# Patient Record
Sex: Female | Born: 1985
Health system: Southern US, Community
[De-identification: ages and names within clinical notes are randomized; demographics above are authoritative.]

## PROBLEM LIST (undated history)

## (undated) DIAGNOSIS — Z789 Other specified health status: Secondary | ICD-10-CM

## (undated) HISTORY — PX: NO PAST SURGERIES: SHX2092

## (undated) HISTORY — PX: MYRINGOTOMY WITH TUBE PLACEMENT: SHX5663

---

## 2015-02-08 ENCOUNTER — Other Ambulatory Visit: Payer: Self-pay | Admitting: General Surgery

## 2015-03-02 ENCOUNTER — Encounter: Payer: Managed Care, Other (non HMO) | Attending: General Surgery | Admitting: Dietician

## 2015-03-02 ENCOUNTER — Encounter: Payer: Self-pay | Admitting: Dietician

## 2015-03-02 DIAGNOSIS — Z713 Dietary counseling and surveillance: Secondary | ICD-10-CM | POA: Diagnosis not present

## 2015-03-02 DIAGNOSIS — Z6841 Body Mass Index (BMI) 40.0 and over, adult: Secondary | ICD-10-CM | POA: Insufficient documentation

## 2015-03-02 NOTE — Progress Notes (Signed)
  Pre-Op Assessment Visit:  Pre-Operative Sleeve gastrectomy Surgery  Medical Nutrition Therapy:  Appt start time: 140   End time:  230.  Patient was seen on 03/02/2015 for Pre-Operative Nutrition Assessment. Assessment and letter of approval faxed to [facility] Bariatric Surgery Program coordinator on 03/02/2015.   Preferred Learning Style:   No preference indicated   Learning Readiness:   Ready  Handouts given during visit include:  Pre-Op Goals Bariatric Surgery Protein Shakes   During the appointment today the following Pre-Op Goals were reviewed with the patient: Maintain or lose weight as instructed by your surgeon Make healthy food choices Begin to limit portion sizes Limited concentrated sugars and fried foods Keep fat/sugar in the single digits per serving on   food labels Practice CHEWING your food  (aim for 30 chews per bite or until applesauce consistency) Practice not drinking 15 minutes before, during, and 30 minutes after each meal/snack Avoid all carbonated beverages  Avoid/limit caffeinated beverages  Avoid all sugar-sweetened beverages Consume 3 meals per day; eat every 3-5 hours Make a list of non-food related activities Aim for 64-100 ounces of FLUID daily  Aim for at least 60-80 grams of PROTEIN daily Look for a liquid protein source that contain ?15 g protein and ?5 g carbohydrate  (ex: shakes, drinks, shots)  Patient-Centered Goals: -Able to be active with kids -Intimacy -Prevention of health problems -Feeling more comfortable and improved self esteem  Scale of 1-10: confidence (10) /importance (10)  Demonstrated degree of understanding via:  Teach Back  Teaching Method Utilized: Visual Auditory Hands on  Barriers to learning/adherence to lifestyle change: none  [facility] to [facility] the [facility] to enroll in Pre-Op and Post-Op Nutrition Education when surgery date is scheduled.

## 2015-04-06 ENCOUNTER — Encounter: Payer: Managed Care, Other (non HMO) | Attending: General Surgery | Admitting: Dietician

## 2015-04-06 ENCOUNTER — Encounter: Payer: Self-pay | Admitting: Dietician

## 2015-04-06 DIAGNOSIS — Z6841 Body Mass Index (BMI) 40.0 and over, adult: Secondary | ICD-10-CM | POA: Diagnosis not present

## 2015-04-06 DIAGNOSIS — Z713 Dietary counseling and surveillance: Secondary | ICD-10-CM | POA: Diagnosis not present

## 2015-04-06 NOTE — Progress Notes (Signed)
Supervised Weight Loss:  Appt start time: 1015 end time:  1030.  SWL visit 1:  Primary concerns today: Beverly Christian returns today for her 1st SWL visit in preparation for sleeve gastrectomy having lost 1/2 a pound. She has been working on cutting back on Coke (caffeine) and chocolate. Her grandfather passed away and she has also been sick lately.   Weight: 270.4 lbs BMI: 44.4  Goals: -Keep working on cutting back on caffeine -Work on reducing portion sizes -Practice cutting back on fast food  MEDICATIONS: see list  DIETARY INTAKE:  24-hr recall:  B ( AM): sausage biscuit  Snk ( AM): apple or fruit cup  L ( PM): [place]key sandwich  Snk ( PM): carrots D ( PM): chicken and potato salad  Snk ( PM): sometimes a cookie or Cheezits  Beverages: tea, water  Recent physical activity: none  Estimated energy needs: 1500-1600 calories  Progress Towards Goal(s):  In progress.   Nutritional Diagnosis:  [place]-3.3 Overweight/obesity related to past poor dietary habits and physical inactivity as evidenced by patient in SWL for pending bariatric surgery following dietary guidelines for continued weight loss.     Intervention:  Nutrition counseling provided.  Handouts given during visit include:  Fast food guide  Monitoring/Evaluation:  Dietary intake, exercise, and body weight in 4 week(s).

## 2015-04-06 NOTE — Patient Instructions (Signed)
-  Keep working on cutting back on caffeine -Work on reducing portion sizes -Practice cutting back on fast food

## 2015-05-04 ENCOUNTER — Ambulatory Visit: Payer: Managed Care, Other (non HMO) | Admitting: Dietician

## 2015-05-07 ENCOUNTER — Encounter: Payer: Managed Care, Other (non HMO) | Attending: General Surgery | Admitting: Dietician

## 2015-05-07 ENCOUNTER — Encounter: Payer: Self-pay | Admitting: Dietician

## 2015-05-07 DIAGNOSIS — Z6841 Body Mass Index (BMI) 40.0 and over, adult: Secondary | ICD-10-CM | POA: Insufficient documentation

## 2015-05-07 DIAGNOSIS — Z713 Dietary counseling and surveillance: Secondary | ICD-10-CM | POA: Diagnosis not present

## 2015-05-07 NOTE — Progress Notes (Signed)
Supervised Weight Loss:  Appt start time: 440 end time:  455  SWL visit 2:  Primary concerns today: Returns with a 4 lbs weight gain. Has been working on decreasing portion sizes. Feels like she has had a busy month. Eating 3 meals per day. Still drinking Coke. Has not tried protein shakes. Has been walking 1-2 x week for about 30 minutes.   Weight: 274.5 lbs BMI: 45.0  Goals: -Keep working on cutting back on caffeine (Coke, chocolate, tea) -Talk to your doctor about migraine medication -Work on reducing portion sizes -Practice cutting back on fast food/do some more meal planning -Work on not drinking during (no drinking 15 minutes before, during a meal, and waiting 30 minutes after meal) -Start working on chewing 20-30 x per bite (applesauce consistently) (take 20 minutes to eat when possible)  MEDICATIONS: see list  DIETARY INTAKE:  24-hr recall:  B ( AM): sausage biscuit  Snk ( AM): apple or fruit cup  L ( PM): [place]key sandwich  Snk ( PM): carrots D ( PM): chicken and potato salad  Snk ( PM): sometimes a cookie or Cheezits  Beverages: tea, water  Recent physical activity: none  Estimated energy needs: 1500-1600 calories  Progress Towards Goal(s):  In progress.   Nutritional Diagnosis:  [place]-3.3 Overweight/obesity related to past poor dietary habits and physical inactivity as evidenced by patient in SWL for pending bariatric surgery following dietary guidelines for continued weight loss.     Intervention:  Nutrition counseling provided.  Handouts given during visit include:  none  Monitoring/Evaluation:  Dietary intake, exercise, and body weight in 4 week(s).

## 2015-05-07 NOTE — Patient Instructions (Addendum)
-  Keep working on cutting back on caffeine (Coke, chocolate, tea) -Talk to your doctor about migraine medication -Work on reducing portion sizes -Practice cutting back on fast food/do some more meal planning -Work on not drinking during (no drinking 15 minutes before, during a meal, and waiting 30 minutes after meal) -Start working on chewing 20-30 x per bite (applesauce consistently) (take 20 minutes to eat when possible)

## 2015-06-06 ENCOUNTER — Telehealth: Payer: Self-pay
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
## 2015-06-08 ENCOUNTER — Ambulatory Visit: Payer: Managed Care, Other (non HMO) | Admitting: Dietician

## 2015-06-11 ENCOUNTER — Encounter: Payer: Managed Care, Other (non HMO) | Attending: General Surgery | Admitting: Skilled Nursing Facility1

## 2015-06-11 ENCOUNTER — Encounter: Payer: Self-pay | Admitting: Skilled Nursing Facility1

## 2015-06-11 DIAGNOSIS — Z713 Dietary counseling and surveillance: Secondary | ICD-10-CM | POA: Diagnosis not present

## 2015-06-11 DIAGNOSIS — Z6841 Body Mass Index (BMI) 40.0 and over, adult: Secondary | ICD-10-CM | POA: Diagnosis not present

## 2015-06-11 NOTE — Progress Notes (Signed)
Supervised Weight Loss:  Appt start time: 9:00 end time:  9:15  SWL visit 3:  Primary concerns today: Returns with 4 lb weight gain. Pt states she Has been working on decreasing her portion sizes by using smaller plates/bowls. Pt states she is only eating 2 meals a day due to forgetting to eat/being too busy at work to eat. Pt states she is Still drinking Coke/caffinated beverages/chocolate but has decreased the frequency of it. Pt states she Has not tried protein shakes but will try some her boyfriend already has at the house. Pt states she has not been physically active but she will aim for 3 days a week about 10 minutes; pt states her lungs hurt too bad to be physically active more than that. Pt states she has not had any headaches due to weaning off caffeine. Pt has not been working on fast food consumption but states she will but it will be difficult.   Weight: 278 lbs BMI: 46.26  Goals: -Keep working on cutting back on caffeine (Coke, chocolate, tea) -Work on reducing portion sizes -Practice cutting back on fast food/do some more meal planning -Work on not drinking during (no drinking 15 minutes before, during a meal, and waiting 30 minutes after meal) -Start working on chewing 20-30 x per bite (applesauce consistently) (take 20 minutes to eat when possible) -Increase your physical activity to do something  MEDICATIONS: see list  DIETARY INTAKE:  24-hr recall:  B ( AM): sausage biscuit  Snk ( AM): apple or fruit cup  L ( PM):  Snk ( PM): carrots D ( PM): chicken and potato salad  Snk ( PM): sometimes a cookie or Cheezits  Beverages: tea, water  Recent physical activity: none  Estimated energy needs: 1500-1600 calories  Progress Towards Goal(s):  In progress.   Nutritional Diagnosis:  [place]-3.3 Overweight/obesity related to past poor dietary habits and physical inactivity as evidenced by patient in SWL for pending bariatric surgery following dietary guidelines for continued  weight loss.     Intervention:  Nutrition counseling provided.  Handouts given during visit include:  none  Monitoring/Evaluation:  Dietary intake, exercise, and body weight in 4 week(s).

## 2015-07-06 ENCOUNTER — Encounter: Payer: Self-pay | Admitting: Dietician

## 2015-07-06 ENCOUNTER — Encounter: Payer: Managed Care, Other (non HMO) | Attending: General Surgery | Admitting: Dietician

## 2015-07-06 DIAGNOSIS — Z6841 Body Mass Index (BMI) 40.0 and over, adult: Secondary | ICD-10-CM | POA: Insufficient documentation

## 2015-07-06 DIAGNOSIS — Z713 Dietary counseling and surveillance: Secondary | ICD-10-CM | POA: Diagnosis not present
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
## 2015-07-06 NOTE — Patient Instructions (Addendum)
-  Keep working on cutting back on caffeine (Coke, chocolate, tea) -Talk to your doctor about migraine medication -Work on reducing portion sizes -Practice cutting back on fast food/do some more meal planning -Work on not drinking during (no drinking 15 minutes before, during a meal, and waiting 30 minutes after meal)  -Keep practicing staying hydrated when you are not eating -Start working on chewing 20-30 x per bite (applesauce consistently) (take 20 minutes to eat when possible) -Try an approved protein shake  

## 2015-08-03 ENCOUNTER — Encounter: Payer: Self-pay | Admitting: Dietician

## 2015-08-03 ENCOUNTER — Encounter: Payer: Managed Care, Other (non HMO) | Attending: General Surgery | Admitting: Dietician

## 2015-08-03 DIAGNOSIS — Z713 Dietary counseling and surveillance: Secondary | ICD-10-CM | POA: Diagnosis not present

## 2015-08-03 DIAGNOSIS — Z6841 Body Mass Index (BMI) 40.0 and over, adult: Secondary | ICD-10-CM | POA: Insufficient documentation

## 2015-08-03 NOTE — Progress Notes (Signed)
Supervised Weight Loss:  Appt start time: 9:55 end time:  10:10  SWL visit 5:  Primary concerns today: Returns having lost 5 pounds in the last month. She reports she has completely cut out Coke. Still struggling to reduce tea. Working on being more physically active overall. Practicing not drinking while eating.   Weight: 278.9 lbs BMI: 45.8  Goals: -Keep working on cutting back on tea -Work on reducing portion sizes -Practice cutting back on fast food/do some more meal planning -Work on not drinking during (no drinking 15 minutes before, during a meal, and waiting 30 minutes after meal) -Start working on chewing 20-30 x per bite (applesauce consistently) (take 20 minutes to eat when possible) -Increase your physical activity to do something  MEDICATIONS: see list  DIETARY INTAKE:  24-hr recall:  B ( AM): sausage biscuit  Snk ( AM): apple or fruit cup  L ( PM):  Snk ( PM): carrots D ( PM): chicken and potato salad  Snk ( PM): sometimes a cookie or Cheezits  Beverages: tea, water  Recent physical activity: none  Estimated energy needs: 1500-1600 calories  Progress Towards Goal(s):  In progress.   Nutritional Diagnosis:  [place]-3.3 Overweight/obesity related to past poor dietary habits and physical inactivity as evidenced by patient in SWL for pending bariatric surgery following dietary guidelines for continued weight loss.     Intervention:  Nutrition counseling provided.  Handouts given during visit include:  none  Monitoring/Evaluation:  Dietary intake, exercise, and body weight in 4 week(s).

## 2015-09-04 ENCOUNTER — Encounter: Payer: Self-pay | Admitting: Dietician

## 2015-09-04 ENCOUNTER — Encounter: Payer: Managed Care, Other (non HMO) | Attending: General Surgery | Admitting: Dietician

## 2015-09-04 DIAGNOSIS — Z6841 Body Mass Index (BMI) 40.0 and over, adult: Secondary | ICD-10-CM | POA: Insufficient documentation

## 2015-09-04 DIAGNOSIS — Z713 Dietary counseling and surveillance: Secondary | ICD-10-CM | POA: Insufficient documentation

## 2015-09-04 NOTE — Progress Notes (Signed)
Supervised Weight Loss:  Appt start time: 505 end time:  520  SWL visit 6:  Primary concerns today: Beverly Christian returns having lost 2 pounds in the last month. She reports she has been more active on the weekends. Also cooking at home more than going out to eat. Has remained off of Coke. Still really struggling with avoiding fluids with meals. Still practicing chewing thoroughly but feels like this is still a work in progress. Drinking unsweetened tea in the mornings and finding it hard to give up caffeine in the mornings.   Weight: 276.9 lbs BMI: 45.5  Goals: -Try decaf tea in the mornings -Work on reducing portion sizes -Practice cutting back on fast food/do some more meal planning -Work on not drinking during (no drinking 15 minutes before, during a meal, and waiting 30 minutes after meal) -Start working on chewing 20-30 x per bite (applesauce consistently) (take 20 minutes to eat when possible)  MEDICATIONS: see list  DIETARY INTAKE:  Beverages: tea, water  Recent physical activity: none  Estimated energy needs: 1500-1600 calories  Progress Towards Goal(s):  In progress.   Nutritional Diagnosis:  [place]-3.3 Overweight/obesity related to past poor dietary habits and physical inactivity as evidenced by patient in SWL for pending bariatric surgery following dietary guidelines for continued weight loss.     Intervention:  Nutrition counseling provided.  Handouts given during visit include:  none  Monitoring/Evaluation:  Dietary intake, exercise, and body weight in 4 week(s).

## 2015-09-26 ENCOUNTER — Other Ambulatory Visit (HOSPITAL_COMMUNITY): Payer: Self-pay | Admitting: General Surgery

## 2015-10-02 ENCOUNTER — Encounter: Payer: Managed Care, Other (non HMO) | Attending: General Surgery | Admitting: Dietician

## 2015-10-02 DIAGNOSIS — Z6841 Body Mass Index (BMI) 40.0 and over, adult: Secondary | ICD-10-CM | POA: Insufficient documentation

## 2015-10-02 DIAGNOSIS — Z713 Dietary counseling and surveillance: Secondary | ICD-10-CM | POA: Insufficient documentation

## 2015-10-02 NOTE — Progress Notes (Signed)
Supervised Weight Loss:  Appt start time: 520 end time:  535  SWL visit 7:  Primary concerns today: Beverly Christian returns having lost 5.5 pounds in the last month. She reports she has continued to eat well. Eating a lean, high protein breakfast and has a sandwich or salad for lunch and dinner. Working on phasing out caffeine in the mornings (tea) and is now able to go an entire day without caffeine. Has not had any Coke in months. She has also been practicing not drinking with meals; finds that she is getting better with this. Continues to try to stay active on the weekends.   Weight: 271.5 lbs BMI: 44.6  Goals: -Try decaf tea in the mornings -Work on reducing portion sizes -Practice cutting back on fast food/do some more meal planning -Work on not drinking during (no drinking 15 minutes before, during a meal, and waiting 30 minutes after meal) -Start working on chewing 20-30 x per bite (applesauce consistently) (take 20 minutes to eat when possible)  MEDICATIONS: see list  DIETARY INTAKE:  Beverages: tea, water  Recent physical activity: none  Estimated energy needs: 1500-1600 calories  Progress Towards Goal(s):  In progress.   Nutritional Diagnosis:  [place]-3.3 Overweight/obesity related to past poor dietary habits and physical inactivity as evidenced by patient in SWL for pending bariatric surgery following dietary guidelines for continued weight loss.     Intervention:  Nutrition counseling provided.  Handouts given during visit include:  none  Monitoring/Evaluation:  Dietary intake, exercise, and body weight in 4 week(s).

## 2015-10-03 ENCOUNTER — Encounter: Payer: Self-pay | Admitting: Dietician

## 2015-10-14 ENCOUNTER — Ambulatory Visit (HOSPITAL_COMMUNITY)
Admission: RE | Admit: 2015-10-14 | Discharge: 2015-10-14 | Disposition: A | Discharge status: Home / Self Care | Payer: Managed Care, Other (non HMO) | Source: Ambulatory Visit | Attending: General Surgery | Admitting: General Surgery

## 2015-11-06 ENCOUNTER — Ambulatory Visit: Payer: Managed Care, Other (non HMO) | Admitting: Dietician

## 2015-11-07 ENCOUNTER — Encounter: Payer: Managed Care, Other (non HMO) | Attending: General Surgery | Admitting: Dietician

## 2015-11-07 ENCOUNTER — Encounter: Payer: Self-pay | Admitting: Dietician

## 2015-11-07 ENCOUNTER — Encounter (HOSPITAL_COMMUNITY): Admission: RE | Admit: 2015-11-07 | Payer: Managed Care, Other (non HMO) | Source: Ambulatory Visit

## 2015-11-07 DIAGNOSIS — Z713 Dietary counseling and surveillance: Secondary | ICD-10-CM | POA: Diagnosis not present

## 2015-11-07 DIAGNOSIS — Z6841 Body Mass Index (BMI) 40.0 and over, adult: Secondary | ICD-10-CM | POA: Diagnosis not present

## 2015-11-07 NOTE — Patient Instructions (Signed)
-  Keep working on cutting back on caffeine (Coke, chocolate, tea) -Talk to your doctor about migraine medication -Work on reducing portion sizes -Practice cutting back on fast food/do some more meal planning -Work on not drinking during (no drinking 15 minutes before, during a meal, and waiting 30 minutes after meal)  -Keep practicing staying hydrated when you are not eating -Start working on chewing 20-30 x per bite (applesauce consistently) (take 20 minutes to eat when possible) -Try an approved protein shake  

## 2015-11-07 NOTE — Progress Notes (Signed)
Supervised Weight Loss:  Appt start time: 925 end time:  940  SWL visit 8:  Primary concerns today: Beverly Christian returns having lost 6 pounds in the last month. She is still avoiding Coke and drinking plenty of water. Has weaned off of caffeine completely. Feeling like avoiding fluids with meals is becoming more of a habit. Trying to stay active overall. Has tried some approved protein shakes and likes them.   Weight: 265.5 lbs BMI: 43.6  Goals: -Try decaf tea in the mornings -Work on reducing portion sizes -Practice cutting back on fast food/do some more meal planning -Work on not drinking during (no drinking 15 minutes before, during a meal, and waiting 30 minutes after meal) -Start working on chewing 20-30 x per bite (applesauce consistently) (take 20 minutes to eat when possible)  MEDICATIONS: see list  DIETARY INTAKE:  Beverages: tea, water  Recent physical activity: none  Estimated energy needs: 1500-1600 calories  Progress Towards Goal(s):  In progress.   Nutritional Diagnosis:  [place]-3.3 Overweight/obesity related to past poor dietary habits and physical inactivity as evidenced by patient in SWL for pending bariatric surgery following dietary guidelines for continued weight loss.     Intervention:  Nutrition counseling provided.  Handouts given during visit include:  none  Monitoring/Evaluation:  Dietary intake, exercise, and body weight in 4 week(s).

## 2015-12-04 ENCOUNTER — Encounter: Payer: Managed Care, Other (non HMO) | Attending: General Surgery | Admitting: Dietician

## 2015-12-04 ENCOUNTER — Ambulatory Visit (INDEPENDENT_AMBULATORY_CARE_PROVIDER_SITE_OTHER): Payer: Managed Care, Other (non HMO) | Admitting: Licensed Clinical Social Worker

## 2015-12-04 ENCOUNTER — Encounter: Payer: Self-pay | Admitting: Dietician

## 2015-12-04 DIAGNOSIS — F509 Eating disorder, unspecified: Secondary | ICD-10-CM | POA: Diagnosis not present

## 2015-12-04 DIAGNOSIS — Z6841 Body Mass Index (BMI) 40.0 and over, adult: Secondary | ICD-10-CM | POA: Diagnosis not present

## 2015-12-04 DIAGNOSIS — Z713 Dietary counseling and surveillance: Secondary | ICD-10-CM | POA: Diagnosis not present

## 2015-12-04 NOTE — Progress Notes (Signed)
Supervised Weight Loss:  Appt start time: 450 end time:  505  Final SWL visit :  Primary concerns today: Beverly Christian returns having lost 3.5 pounds in the last month. She has been drinking Premier protein shakes for breakfast and lunch. Has continued to avoid sodas. Has made avoiding fluids with meals a habit. Has also increased physical activity.   Weight: 262.1 lbs BMI: 43  Goals: Contact [facility] when surgery is scheduled to sign up for pre op class  MEDICATIONS: see list  DIETARY INTAKE:  Beverages: tea, water  Recent physical activity: none  Estimated energy needs: 1500-1600 calories  Progress Towards Goal(s):  In progress.   Nutritional Diagnosis:  [place]-3.3 Overweight/obesity related to past poor dietary habits and physical inactivity as evidenced by patient in SWL for pending bariatric surgery following dietary guidelines for continued weight loss.     Intervention:  Nutrition counseling provided.  Handouts given during visit include:  none  Monitoring/Evaluation:  Dietary intake, exercise, and body weight in 4 week(s).

## 2015-12-04 NOTE — Patient Instructions (Signed)
-  Keep working on cutting back on caffeine (Coke, chocolate, tea) -Talk to your doctor about migraine medication -Work on reducing portion sizes -Practice cutting back on fast food/do some more meal planning -Work on not drinking during (no drinking 15 minutes before, during a meal, and waiting 30 minutes after meal)  -Keep practicing staying hydrated when you are not eating -Start working on chewing 20-30 x per bite (applesauce consistently) (take 20 minutes to eat when possible) -Try an approved protein shake

## 2015-12-24 NOTE — Progress Notes (Signed)
  Pre-Operative Nutrition Class:  Appt start time: 2878   End time:  1830.  Patient was seen on 12/23/2015 for Pre-Operative Bariatric Surgery Education at [facility].   Surgery date:  Surgery type: sleeve gastrectomy Start weight at [facility]: 271 lbs on 03/02/2015 Weight today: patient declined updated weight  TANITA  BODY COMP RESULTS  12/23/15   BMI (kg/m^2) N/A   Fat Mass (lbs)    Fat Free Mass (lbs)    Total Body Water (lbs)    Samples given per MNT protocol. Patient educated on appropriate usage: Bariatric Advantage Calcium Citrate (caramel - qty 1) Lot #: [id number] Exp: 04/2016  Premier protein shake (strawberry - qty 1) Lot #: [id number] Exp: 09/2016  Unjury protein powder (vanilla - qty 1) Lot #: [id number] Exp: 01/2016  The following the learning objectives were met by the patient during this course:  Identify Pre-Op Dietary Goals and will begin 2 weeks pre-operatively  Identify appropriate sources of fluids and proteins   State protein recommendations and appropriate sources pre and post-operatively  Identify Post-Operative Dietary Goals and will follow for 2 weeks post-operatively  Identify appropriate multivitamin and calcium sources  Describe the need for physical activity post-operatively and will follow MD recommendations  State when to call healthcare provider regarding medication questions or post-operative complications  Handouts given during class include:  Pre-Op Bariatric Surgery Diet Handout  Protein Shake Handout  Post-Op Bariatric Surgery Nutrition Handout  [facility] Program Information Flyer  Support Group Information Flyer  [facility] Bariatric Supplements Price List  Follow-Up Plan: Patient will follow-up at [facility] 2 weeks post operatively for diet advancement per MD.

## 2015-12-27 ENCOUNTER — Other Ambulatory Visit: Payer: Self-pay | Admitting: General Surgery

## 2016-01-01 ENCOUNTER — Encounter (HOSPITAL_COMMUNITY)
Admission: RE | Admit: 2016-01-01 | Discharge: 2016-01-01 | Disposition: A | Discharge status: Home / Self Care | Payer: Managed Care, Other (non HMO) | Source: Ambulatory Visit | Attending: General Surgery | Admitting: General Surgery

## 2016-01-01 ENCOUNTER — Encounter (HOSPITAL_COMMUNITY): Payer: Self-pay

## 2016-01-01 DIAGNOSIS — Z6841 Body Mass Index (BMI) 40.0 and over, adult: Secondary | ICD-10-CM | POA: Diagnosis not present

## 2016-01-01 DIAGNOSIS — Z01812 Encounter for preprocedural laboratory examination: Secondary | ICD-10-CM | POA: Diagnosis present

## 2016-01-01 HISTORY — DX: Other specified health status: Z78.9

## 2016-01-01 LAB — COMPREHENSIVE METABOLIC PANEL
ALT: 16 U/L (ref 14–54)
ANION GAP: 8 (ref 5–15)
AST: 26 U/L (ref 15–41)
Albumin: 4.7 g/dL (ref 3.5–5.0)
Alkaline Phosphatase: 58 U/L (ref 38–126)
BUN: 16 mg/dL (ref 6–20)
CHLORIDE: 104 mmol/L (ref 101–111)
CO2: 26 mmol/L (ref 22–32)
Calcium: 9.2 mg/dL (ref 8.9–10.3)
Creatinine, Ser: 0.64 mg/dL (ref 0.44–1.00)
GFR calc non Af Amer: >60 mL/min (ref >60)
Glucose, Bld: 85 mg/dL (ref 65–99)
Potassium: 4.4 mmol/L (ref 3.5–5.1)
SODIUM: 138 mmol/L (ref 135–145)
Total Bilirubin: 1.9 mg/dL — ABNORMAL HIGH (ref 0.3–1.2)
Total Protein: 7.9 g/dL (ref 6.5–8.1)

## 2016-01-01 LAB — CBC WITH DIFFERENTIAL/PLATELET
BASOS PCT: 0 %
Basophils Absolute: 0 10*3/uL (ref 0.0–0.1)
Eosinophils Absolute: 0.2 10*3/uL (ref 0.0–0.7)
Eosinophils Relative: 3 %
HEMATOCRIT: 42.5 % (ref 36.0–46.0)
Hemoglobin: 14.4 g/dL (ref 12.0–15.0)
LYMPHS ABS: 1.7 10*3/uL (ref 0.7–4.0)
Lymphocytes Relative: 23 %
MCH: 28.5 pg (ref 26.0–34.0)
MCHC: 33.9 g/dL (ref 30.0–36.0)
MCV: 84 fL (ref 78.0–100.0)
MONOS PCT: 4 %
Monocytes Absolute: 0.3 10*3/uL (ref 0.1–1.0)
NEUTROS ABS: 5.2 10*3/uL (ref 1.7–7.7)
NEUTROS PCT: 70 %
Platelets: 311 10*3/uL (ref 150–400)
RBC: 5.06 MIL/uL (ref 3.87–5.11)
RDW: 13.4 % (ref 11.5–15.5)
WBC: 7.5 10*3/uL (ref 4.0–10.5)

## 2016-01-01 NOTE — Patient Instructions (Signed)
Beverly Christian  01/01/2016   Your procedure is scheduled on: 01/07/16  Report to [facility] Main  Entrance take [facility]  elevators to 3rd floor to  Short Stay Center at 10:15  AM.  Call this number if you have problems the morning of surgery [phone]   Remember: ONLY 1 PERSON MAY GO WITH YOU TO SHORT STAY TO GET  READY MORNING OF YOUR SURGERY.  Do not eat food or drink liquids :After Midnight Monday     Take these medicines the morning of surgery with A SIP OF WATER: none                                You may not have any metal on your body including hair pins and              piercings  Do not wear jewelry, make-up, lotions, powders or perfumes, deodorant             Do not wear nail polish.  Do not shave  48 hours prior to surgery.                 Do not bring valuables to the hospital. [facility] IS NOT             RESPONSIBLE   FOR VALUABLES.  Contacts, dentures or bridgework may not be worn into surgery.  Leave suitcase in the car. After surgery it may be brought to your room.     _____________________________________________________________________             [facility] - Preparing for Surgery  Before surgery, you can play an important role.  Because skin is not sterile, your skin needs to be as free of germs as possible.  You can reduce the number of germs on you skin by washing with CHG (chlorahexidine gluconate) soap before surgery.  CHG is an antiseptic cleaner which kills germs and bonds with the skin to continue killing germs even after washing.  Please DO NOT use if you have an allergy to CHG or antibacterial soaps.  If your skin becomes reddened/irritated stop using the CHG and inform your nurse when you arrive at Short Stay.  Do not shave (including legs and underarms) for at least 48 hours prior to the first CHG shower.  You may shave your face.  Please follow these instructions carefully:   1.  Shower with CHG Soap the night before  surgery and the                                morning of Surgery.  2.  If you choose to wash your hair, wash your hair first as usual with your       normal shampoo.  3.  After you shampoo, rinse your hair and body thoroughly to remove the                      Shampoo.  4.  Use CHG as you would any other liquid soap.  You can apply chg directly       to the skin and wash gently with scrungie or a clean washcloth.  5.  Apply the CHG Soap to your body ONLY FROM THE NECK DOWN.  Do not use on open wounds or open sores.  Avoid contact with your eyes,       ears, mouth and genitals (private parts).  Wash genitals (private parts)       with your normal soap.  6.  Wash thoroughly, paying special attention to the area where your surgery        will be performed.  7.  Thoroughly rinse your body with warm water from the neck down.  8.  DO NOT shower/wash with your normal soap after using and rinsing off       the CHG Soap.  9.  Pat yourself dry with a clean towel.            10.  Wear clean pajamas.            11.  Place clean sheets on your bed the night of your first shower and do not        sleep with pets.  Day of Surgery  Do not apply any lotions/deoderants the morning of surgery.  Please wear clean clothes to the hospital/surgery center.

## 2016-01-02 LAB — HEMOGLOBIN A1C
Hgb A1c MFr Bld: 4.7 % — ABNORMAL LOW (ref 4.8–5.6)
MEAN PLASMA GLUCOSE: 88 mg/dL

## 2016-01-06 MED ORDER — LEVOFLOXACIN IN D5W 750 MG/150ML IV SOLN
750.0000 mg | INTRAVENOUS | Status: AC
  Administered 2016-01-07: 750 mg via INTRAVENOUS
  Filled 2016-01-06: qty 150

## 2016-01-07 ENCOUNTER — Inpatient Hospital Stay (HOSPITAL_COMMUNITY): Payer: Managed Care, Other (non HMO) | Admitting: Anesthesiology

## 2016-01-07 ENCOUNTER — Encounter (HOSPITAL_COMMUNITY)
Admission: RE | Disposition: A | Discharge status: Home / Self Care | Payer: Self-pay | Source: Ambulatory Visit | Attending: General Surgery

## 2016-01-07 ENCOUNTER — Encounter (HOSPITAL_COMMUNITY): Payer: Self-pay | Admitting: *Deleted

## 2016-01-07 ENCOUNTER — Inpatient Hospital Stay (HOSPITAL_COMMUNITY)
Admission: RE | Admit: 2016-01-07 | Discharge: 2016-01-08 | DRG: 621 | Disposition: A | Discharge status: Home / Self Care | Payer: Managed Care, Other (non HMO) | Source: Ambulatory Visit | Attending: General Surgery | Admitting: General Surgery

## 2016-01-07 DIAGNOSIS — M2550 Pain in unspecified joint: Secondary | ICD-10-CM | POA: Diagnosis present

## 2016-01-07 DIAGNOSIS — F329 Major depressive disorder, single episode, unspecified: Secondary | ICD-10-CM | POA: Diagnosis present

## 2016-01-07 DIAGNOSIS — Z6841 Body Mass Index (BMI) 40.0 and over, adult: Secondary | ICD-10-CM | POA: Diagnosis not present

## 2016-01-07 DIAGNOSIS — Z833 Family history of diabetes mellitus: Secondary | ICD-10-CM

## 2016-01-07 DIAGNOSIS — R0602 Shortness of breath: Secondary | ICD-10-CM | POA: Diagnosis present

## 2016-01-07 DIAGNOSIS — Z79899 Other long term (current) drug therapy: Secondary | ICD-10-CM

## 2016-01-07 DIAGNOSIS — R5383 Other fatigue: Secondary | ICD-10-CM | POA: Diagnosis present

## 2016-01-07 HISTORY — PX: LAPAROSCOPIC GASTRIC SLEEVE RESECTION: SHX5895

## 2016-01-07 LAB — PREGNANCY, URINE: PREG TEST UR: NEGATIVE

## 2016-01-07 LAB — HEMOGLOBIN AND HEMATOCRIT, BLOOD
HCT: 42.1 % (ref 36.0–46.0)
HEMOGLOBIN: 14.1 g/dL (ref 12.0–15.0)

## 2016-01-07 SURGERY — GASTRECTOMY, SLEEVE, LAPAROSCOPIC
Anesthesia: General

## 2016-01-07 MED ORDER — SODIUM CHLORIDE 0.9 % IJ SOLN
INTRAMUSCULAR | Status: DC | PRN
  Administered 2016-01-07: 50 mL

## 2016-01-07 MED ORDER — FENTANYL CITRATE (PF) 250 MCG/5ML IJ SOLN
INTRAMUSCULAR | Status: DC | PRN
  Administered 2016-01-07 (×2): 100 ug via INTRAVENOUS
  Administered 2016-01-07: 50 ug via INTRAVENOUS

## 2016-01-07 MED ORDER — ONDANSETRON HCL 4 MG/2ML IJ SOLN
INTRAMUSCULAR | Status: AC
  Filled 2016-01-07: qty 2

## 2016-01-07 MED ORDER — PROMETHAZINE HCL 25 MG/ML IJ SOLN
INTRAMUSCULAR | Status: AC
  Administered 2016-01-07: 25 mg
  Filled 2016-01-07: qty 1

## 2016-01-07 MED ORDER — EVICEL 5 ML EX KIT
PACK | Freq: Once | CUTANEOUS | Status: AC
  Administered 2016-01-07: 5 mL
  Filled 2016-01-07: qty 1

## 2016-01-07 MED ORDER — DEXAMETHASONE SODIUM PHOSPHATE 10 MG/ML IJ SOLN
INTRAMUSCULAR | Status: DC | PRN
  Administered 2016-01-07: 10 mg via INTRAVENOUS

## 2016-01-07 MED ORDER — FENTANYL CITRATE (PF) 100 MCG/2ML IJ SOLN: 25.0000 ug | INTRAMUSCULAR | Status: DC | PRN

## 2016-01-07 MED ORDER — PROMETHAZINE HCL 25 MG/ML IJ SOLN: 12.5000 mg | Freq: Once | INTRAMUSCULAR | Status: DC | PRN

## 2016-01-07 MED ORDER — DEXAMETHASONE SODIUM PHOSPHATE 10 MG/ML IJ SOLN
INTRAMUSCULAR | Status: AC
  Filled 2016-01-07: qty 1

## 2016-01-07 MED ORDER — OXYCODONE HCL 5 MG/5ML PO SOLN
5.0000 mg | ORAL | Status: DC | PRN
Start: 2016-01-08 — End: 2016-01-08
  Administered 2016-01-08: 5 mg via ORAL
  Administered 2016-01-08: 10 mg via ORAL
  Administered 2016-01-08: 5 mg via ORAL
  Filled 2016-01-07: qty 5
  Filled 2016-01-07: qty 25
  Filled 2016-01-07: qty 10
  Filled 2016-01-07: qty 5

## 2016-01-07 MED ORDER — ACETAMINOPHEN 10 MG/ML IV SOLN
INTRAVENOUS | Status: AC
  Filled 2016-01-07: qty 100

## 2016-01-07 MED ORDER — PROPOFOL 10 MG/ML IV BOLUS
INTRAVENOUS | Status: DC | PRN
  Administered 2016-01-07: 200 ug via INTRAVENOUS

## 2016-01-07 MED ORDER — SUGAMMADEX SODIUM 200 MG/2ML IV SOLN
INTRAVENOUS | Status: DC | PRN
  Administered 2016-01-07: 200 mg via INTRAVENOUS

## 2016-01-07 MED ORDER — ONDANSETRON HCL 4 MG/2ML IJ SOLN
INTRAMUSCULAR | Status: DC | PRN
  Administered 2016-01-07: 4 mg via INTRAVENOUS

## 2016-01-07 MED ORDER — FENTANYL CITRATE (PF) 100 MCG/2ML IJ SOLN
25.0000 ug | INTRAMUSCULAR | Status: DC | PRN
  Administered 2016-01-07: 25 ug via INTRAVENOUS

## 2016-01-07 MED ORDER — SODIUM CHLORIDE 0.9 % IJ SOLN
INTRAMUSCULAR | Status: AC
  Filled 2016-01-07: qty 50

## 2016-01-07 MED ORDER — ACETAMINOPHEN 160 MG/5ML PO SOLN: 650.0000 mg | ORAL | Status: DC | PRN

## 2016-01-07 MED ORDER — FENTANYL CITRATE (PF) 100 MCG/2ML IJ SOLN
INTRAMUSCULAR | Status: AC
  Filled 2016-01-07: qty 2

## 2016-01-07 MED ORDER — BUPIVACAINE LIPOSOME 1.3 % IJ SUSP
20.0000 mL | Freq: Once | INTRAMUSCULAR | Status: AC
  Administered 2016-01-07: 20 mL
  Filled 2016-01-07: qty 20

## 2016-01-07 MED ORDER — ENOXAPARIN SODIUM 30 MG/0.3ML ~~LOC~~ SOLN
30.0000 mg | Freq: Two times a day (BID) | SUBCUTANEOUS | Status: DC
  Administered 2016-01-07 – 2016-01-08 (×2): 30 mg via SUBCUTANEOUS
  Filled 2016-01-07 (×2): qty 0.3

## 2016-01-07 MED ORDER — METOCLOPRAMIDE HCL 5 MG/ML IJ SOLN
10.0000 mg | Freq: Once | INTRAMUSCULAR | Status: AC
  Administered 2016-01-07: 10 mg via INTRAVENOUS

## 2016-01-07 MED ORDER — ACETAMINOPHEN 10 MG/ML IV SOLN
INTRAVENOUS | Status: DC | PRN
  Administered 2016-01-07: 1000 mg via INTRAVENOUS

## 2016-01-07 MED ORDER — ONDANSETRON HCL 4 MG/2ML IJ SOLN
4.0000 mg | INTRAMUSCULAR | Status: DC | PRN
  Administered 2016-01-07: 4 mg via INTRAVENOUS
  Filled 2016-01-07: qty 2

## 2016-01-07 MED ORDER — MORPHINE SULFATE (PF) 2 MG/ML IV SOLN
2.0000 mg | INTRAVENOUS | Status: DC | PRN
  Administered 2016-01-07 – 2016-01-08 (×4): 2 mg via INTRAVENOUS
  Filled 2016-01-07 (×4): qty 1

## 2016-01-07 MED ORDER — PREMIER PROTEIN SHAKE: 2.0000 [oz_av] | ORAL | Status: DC

## 2016-01-07 MED ORDER — LACTATED RINGERS IR SOLN
Status: DC | PRN
  Administered 2016-01-07: 1000 mL

## 2016-01-07 MED ORDER — SUCCINYLCHOLINE CHLORIDE 20 MG/ML IJ SOLN
INTRAMUSCULAR | Status: DC | PRN
  Administered 2016-01-07: 100 mg via INTRAVENOUS

## 2016-01-07 MED ORDER — LIDOCAINE HCL (CARDIAC) 20 MG/ML IV SOLN
INTRAVENOUS | Status: AC
  Filled 2016-01-07: qty 5

## 2016-01-07 MED ORDER — ROCURONIUM BROMIDE 100 MG/10ML IV SOLN
INTRAVENOUS | Status: AC
Start: 2016-01-07 — End: 2016-01-07
  Filled 2016-01-07: qty 1

## 2016-01-07 MED ORDER — METOCLOPRAMIDE HCL 5 MG/ML IJ SOLN
INTRAMUSCULAR | Status: AC
  Filled 2016-01-07: qty 2

## 2016-01-07 MED ORDER — EVICEL 5 ML EX KIT
PACK | Freq: Once | CUTANEOUS | Status: DC
  Filled 2016-01-07: qty 1

## 2016-01-07 MED ORDER — HYDROMORPHONE HCL 2 MG/ML IJ SOLN
INTRAMUSCULAR | Status: AC
  Filled 2016-01-07: qty 1

## 2016-01-07 MED ORDER — LIDOCAINE HCL (CARDIAC) 20 MG/ML IV SOLN
INTRAVENOUS | Status: DC | PRN
  Administered 2016-01-07: 80 mg via INTRATRACHEAL

## 2016-01-07 MED ORDER — LACTATED RINGERS IV SOLN
INTRAVENOUS | Status: DC
  Administered 2016-01-07 (×3): via INTRAVENOUS

## 2016-01-07 MED ORDER — PROPOFOL 10 MG/ML IV BOLUS
INTRAVENOUS | Status: AC
  Filled 2016-01-07: qty 20

## 2016-01-07 MED ORDER — FAMOTIDINE IN NACL 20-0.9 MG/50ML-% IV SOLN
20.0000 mg | Freq: Two times a day (BID) | INTRAVENOUS | Status: DC
  Administered 2016-01-07 – 2016-01-08 (×2): 20 mg via INTRAVENOUS
  Filled 2016-01-07 (×3): qty 50

## 2016-01-07 MED ORDER — DEXAMETHASONE SODIUM PHOSPHATE 10 MG/ML IJ SOLN
INTRAMUSCULAR | Status: AC
Start: 2016-01-07 — End: 2016-01-07
  Filled 2016-01-07: qty 1

## 2016-01-07 MED ORDER — HEPARIN SODIUM (PORCINE) 5000 UNIT/ML IJ SOLN
5000.0000 [IU] | INTRAMUSCULAR | Status: AC
Start: 2016-01-07 — End: 2016-01-07
  Administered 2016-01-07: 5000 [IU] via SUBCUTANEOUS
  Filled 2016-01-07: qty 1

## 2016-01-07 MED ORDER — POTASSIUM CHLORIDE IN NACL 20-0.9 MEQ/L-% IV SOLN
INTRAVENOUS | Status: DC
  Administered 2016-01-07 – 2016-01-08 (×2): via INTRAVENOUS
  Filled 2016-01-07 (×3): qty 1000

## 2016-01-07 MED ORDER — ROCURONIUM BROMIDE 100 MG/10ML IV SOLN
INTRAVENOUS | Status: DC | PRN
  Administered 2016-01-07: 3 mg via INTRAVENOUS
  Administered 2016-01-07: 47 mg via INTRAVENOUS

## 2016-01-07 MED ORDER — FENTANYL CITRATE (PF) 250 MCG/5ML IJ SOLN
INTRAMUSCULAR | Status: AC
  Filled 2016-01-07: qty 5

## 2016-01-07 MED ORDER — HYDROMORPHONE HCL 1 MG/ML IJ SOLN
INTRAMUSCULAR | Status: DC | PRN
  Administered 2016-01-07 (×2): 1 mg via INTRAVENOUS

## 2016-01-07 MED ORDER — MIDAZOLAM HCL 2 MG/2ML IJ SOLN
INTRAMUSCULAR | Status: AC
  Filled 2016-01-07: qty 2

## 2016-01-07 MED ORDER — ONDANSETRON HCL 4 MG/2ML IJ SOLN
4.0000 mg | Freq: Once | INTRAMUSCULAR | Status: AC
  Administered 2016-01-07: 4 mg via INTRAVENOUS

## 2016-01-07 MED ORDER — ACETAMINOPHEN 160 MG/5ML PO SOLN: 325.0000 mg | ORAL | Status: DC | PRN

## 2016-01-07 MED ORDER — MIDAZOLAM HCL 5 MG/5ML IJ SOLN
INTRAMUSCULAR | Status: DC | PRN
  Administered 2016-01-07: 2 mg via INTRAVENOUS

## 2016-01-07 MED ORDER — 0.9 % SODIUM CHLORIDE (POUR BTL) OPTIME
TOPICAL | Status: DC | PRN
  Administered 2016-01-07: 1000 mL

## 2016-01-07 SURGICAL SUPPLY — 59 items
APPLICATOR COTTON TIP 6IN STRL (MISCELLANEOUS) IMPLANT
APPLIER CLIP ROT 10 11.4 M/L (STAPLE)
APPLIER CLIP ROT 13.4 12 LRG (CLIP)
BLADE SURG SZ11 CARB STEEL (BLADE) ×3 IMPLANT
CABLE HIGH FREQUENCY MONO STRZ (ELECTRODE) ×3 IMPLANT
CHLORAPREP W/TINT 26ML (MISCELLANEOUS) ×3 IMPLANT
CLIP APPLIE ROT 10 11.4 M/L (STAPLE) IMPLANT
CLIP APPLIE ROT 13.4 12 LRG (CLIP) IMPLANT
COVER SURGICAL LIGHT HANDLE (MISCELLANEOUS) IMPLANT
DEVICE PMI PUNCTURE CLOSURE (MISCELLANEOUS) ×3 IMPLANT
DEVICE SUT QUICK LOAD TK 5 (STAPLE) IMPLANT
DEVICE SUT TI-KNOT TK 5X26 (MISCELLANEOUS) IMPLANT
DEVICE SUTURE ENDOST 10MM (ENDOMECHANICALS) IMPLANT
DEVICE TI KNOT TK5 (MISCELLANEOUS)
DRAPE UTILITY XL STRL (DRAPES) ×6 IMPLANT
ELECT REM PT RETURN 9FT ADLT (ELECTROSURGICAL) ×3
ELECTRODE REM PT RTRN 9FT ADLT (ELECTROSURGICAL) ×1 IMPLANT
GAUZE SPONGE 4X4 12PLY STRL (GAUZE/BANDAGES/DRESSINGS) IMPLANT
GLOVE BIOGEL PI IND STRL 7.5 (GLOVE) ×2 IMPLANT
GLOVE BIOGEL PI INDICATOR 7.5 (GLOVE) ×4
GLOVE ECLIPSE 7.5 STRL STRAW (GLOVE) ×3 IMPLANT
GOWN STRL REUS W/TWL XL LVL3 (GOWN DISPOSABLE) ×12 IMPLANT
HOVERMATT SINGLE USE (MISCELLANEOUS) ×3 IMPLANT
IRRIG SUCT STRYKERFLOW 2 WTIP (MISCELLANEOUS) ×3
IRRIGATION SUCT STRKRFLW 2 WTP (MISCELLANEOUS) ×1 IMPLANT
KIT BASIN OR (CUSTOM PROCEDURE TRAY) ×3 IMPLANT
LIQUID BAND (GAUZE/BANDAGES/DRESSINGS) ×3 IMPLANT
MARKER SKIN DUAL TIP RULER LAB (MISCELLANEOUS) ×3 IMPLANT
NEEDLE SPNL 22GX3.5 QUINCKE BK (NEEDLE) ×3 IMPLANT
PACK UNIVERSAL I (CUSTOM PROCEDURE TRAY) ×3 IMPLANT
QUICK LOAD TK 5 (STAPLE)
RELOAD STAPLER BLUE 60MM (STAPLE) ×1 IMPLANT
RELOAD STAPLER GOLD 60MM (STAPLE) ×1 IMPLANT
RELOAD STAPLER GREEN 60MM (STAPLE) ×2 IMPLANT
SCISSORS LAP 5X45 EPIX DISP (ENDOMECHANICALS) ×3 IMPLANT
SHEARS HARMONIC ACE PLUS 45CM (MISCELLANEOUS) ×3 IMPLANT
SLEEVE ADV FIXATION 5X100MM (TROCAR) ×3 IMPLANT
SLEEVE GASTRECTOMY 36FR VISIGI (MISCELLANEOUS) ×3 IMPLANT
SOLUTION ANTI FOG 6CC (MISCELLANEOUS) ×3 IMPLANT
SPONGE LAP 18X18 X RAY DECT (DISPOSABLE) ×3 IMPLANT
STAPLER ECHELON LONG 60 440 (INSTRUMENTS) ×3 IMPLANT
STAPLER RELOAD BLUE 60MM (STAPLE) ×3
STAPLER RELOAD GOLD 60MM (STAPLE) ×3
STAPLER RELOAD GREEN 60MM (STAPLE) ×6
SUT DEVICE BRAIDED 0X39 (SUTURE) IMPLANT
SUT MNCRL AB 4-0 PS2 18 (SUTURE) ×3 IMPLANT
SUT VICRYL 0 TIES 12 18 (SUTURE) ×3 IMPLANT
SYR 10ML ECCENTRIC (SYRINGE) ×3 IMPLANT
SYR 20CC LL (SYRINGE) ×3 IMPLANT
TIP RIGID 35CM EVICEL (HEMOSTASIS) ×3 IMPLANT
TOWEL OR 17X26 10 PK STRL BLUE (TOWEL DISPOSABLE) ×3 IMPLANT
TOWEL OR NON WOVEN STRL DISP B (DISPOSABLE) ×3 IMPLANT
TROCAR ADV FIXATION 5X100MM (TROCAR) ×3 IMPLANT
TROCAR BLADELESS 15MM (ENDOMECHANICALS) ×3 IMPLANT
TROCAR BLADELESS OPT 5 100 (ENDOMECHANICALS) ×3 IMPLANT
TUBING CONNECTING 10 (TUBING) ×2 IMPLANT
TUBING CONNECTING 10' (TUBING) ×1
TUBING ENDO SMARTCAP PENTAX (MISCELLANEOUS) ×3 IMPLANT
TUBING INSUF HEATED (TUBING) ×3 IMPLANT

## 2016-01-07 NOTE — Anesthesia Procedure Notes (Signed)
Procedure Name: Intubation Date/Time: 01/07/2016 12:43 PM Performed by: [place], Eidan Muellner C Pre-anesthesia Checklist: Patient identified, Emergency Drugs available, Suction available and Patient being monitored Patient Re-evaluated:Patient Re-evaluated prior to inductionOxygen Delivery Method: Circle system utilized Preoxygenation: Pre-oxygenation with 100% oxygen Intubation Type: IV induction Ventilation: Mask ventilation without difficulty Laryngoscope Size: Mac and 3 Grade View: Grade I Tube type: Oral Tube size: 7.0 mm Number of attempts: 1 Airway Equipment and Method: Stylet and Oral airway Placement Confirmation: ETT inserted through vocal cords under direct vision,  positive ETCO2 and breath sounds checked- equal and bilateral Secured at: 22 cm Tube secured with: Tape Dental Injury: Teeth and Oropharynx as per pre-operative assessment

## 2016-01-07 NOTE — Op Note (Signed)
Preoperative Diagnosis: Morbid Obesity  Postoprative Diagnosis: Morbid Obesity  Procedure: Procedure(s): LAPAROSCOPIC GASTRIC SLEEVE RESECTION, UPPER ENDOSCOPY   Surgeon: Glenna FellowsHoxworth, Tylen Leverich T   Assistants: Feliciana RossettiLuke Kinsinger  Anesthesia:  General endotracheal anesthesia  Indications: Patient is a 30 year old female with progressive morbid obesity unresponsive to medical management The presents of a BMI of 45.5. After extensive preoperative workup and discussion detailed elsewhere we have elected to proceed with laparoscopic sleeve gastrectomy for surgical treatment of her morbid obesity.    Procedure Detail:  Patient was brought to the operating room, placed in the supine position on the operating table, and general endotracheal anesthesia induced. She had received subcutaneous heparin and preoperative IV antibiotics. PAS were in place. The abdomen was widely sterilely prepped and draped. Patient timeout was performed and correct procedure verified. Access was obtained with a 5 mm Optiview trocar in the left upper quadrant without difficulty and pneumoperitoneum established. No evidence of trocar injury. Under direct vision a 5 mm trocar was placed laterally in the right upper quadrant and a 15 mm trocar in the right upper quadrant near the midline. A 5 mm trocar was placed above and to the left of the umbilicus for the camera port. Under direct vision a bilateral T AP block was performed with Exparel diluted to 80 mL. Through a 5 mm subxiphoid site after placing the patient in steep reverse Trendelenburg the Nathanson retractor was placed and the left lobe the lver which was relatively small was elevated with vessel exposure of the stomach and hiatus. Finally a 5 mm trocar was placed in the left upper quadrant. Beginning at the mid to distal greater curve the vasculature was dissected along the greater curve of the stomach with harmonic scalpel and the lesser sac entered. The vasculature was dissected  proximally along the greater curve. Short gastric vessels were divided. The fundus was mobilized off of attachments to the spleen. The fundus was then completely mobilized off of the left crus which was dissected and exposed along its length. At this point the proximal stomach was completely freed on its lesser curve vasculature. We then dissected distally along the Greater curve to a point Measured 5 cm from the pylorus. The VisiG 6036 French tube was passed orally and positioned along the lesser curve at this tip at the pylorus and placed on suction. The gastrectomy was begun with an additional firing of the 60 mm green load stapler beginning 5 cm from the pylorus and angling upward but staying away from the incisura and VisiG tube. Next a gold load firing was performed which carried the gastrectomy up beyond the incisura again staying a little bit away from the VisiG tube at this point. The sleeve was then completed with one further gold firing into blue load firings which were carried up along next to the tube but not tight against it and the final firing angled slightly out just off of the esophageal fat pad. The staple line appeared intact and without bleeding. Under air pressure inflated through the VisiG and saline Irrigation was no evidence of leak and the sleeve appeared symmetrical without narrowing or twisting. Dr. Sheliah HatchKinsinger performed upper endoscopy again showing no evidence of leak or narrowing. No bleeding. The specimen was removed through the 15 mm trocar site after dilating it slightly. The staple line was coated with Evicel. The 15 mm trocar site was closed with 0 Vicryl. The Nathanson retractor was removed under direct vision and all CO2 evacuated. Trochars were removed and skin incisions closed  with subcuticular 4-0 Monocryl and Liquiban. Sponge needle and instrument counts were correct.   Findings: As above  Estimated Blood Loss:  Minimal         Drains: none  Blood Given: none           Specimens: Sleeve gastrectomy        Complications:  * No complications entered in OR log *         Disposition: PACU - hemodynamically stable.         Condition: stable

## 2016-01-07 NOTE — Transfer of Care (Signed)
Immediate Anesthesia Transfer of Care Note  Patient: Beverly Christian  Procedure(s) Performed: Procedure(s): LAPAROSCOPIC GASTRIC SLEEVE RESECTION, UPPER ENDOSCOPY (N/A)  Patient Location: PACU  Anesthesia Type:General  Level of Consciousness: awake, alert  and oriented  Airway & Oxygen Therapy: Patient Spontanous Breathing and Patient connected to face mask oxygen  Post-op Assessment: Report given to RN and Post -op Vital signs reviewed and stable  Post vital signs: Reviewed and stable  Last Vitals:  Vitals:   01/07/16 1019  BP: (!) 148/103  Pulse: 98  Resp: 18  Temp: 36.9 C    Last Pain:  Vitals:   01/07/16 1019  TempSrc: Oral         Complications: No apparent anesthesia complications

## 2016-01-07 NOTE — Op Note (Signed)
Preoperative diagnosis: laparoscopic sleeve gastrectomy  Postoperative diagnosis: Same   Procedure: Upper endoscopy   Surgeon: Feliciana RossettiLuke Beuford Garcilazo, M.D.  Anesthesia: Gen.   Indications for procedure: This patient was undergoing a laparoscopic sleeve gastrectomy.   Description of procedure: The endoscopy was placed in the mouth and into the oropharynx and under endoscopic vision it was advanced to the esophagogastric junction. The pouch was insufflated and no bleeding or bubbles were seen. The GEJ was identified at 46cm from the teeth.  No bleeding or leaks were detected. The scope was withdrawn without difficulty.   Feliciana RossettiLuke Christia Domke, M.D. General, Bariatric, & Minimally Invasive Surgery [facility]

## 2016-01-07 NOTE — Progress Notes (Addendum)
Patient alert and oriented,  op day.  Provided support and encouragement.  Encouraged pulmonary toilet, ambulation and small sips of liquids.  All questions answered.  Will continue to monitor.  Patient had not yet filled post-op pain prescription, [facility] to have prescription  ready at discharge.

## 2016-01-07 NOTE — Discharge Instructions (Signed)

## 2016-01-07 NOTE — Anesthesia Preprocedure Evaluation (Addendum)
Anesthesia Evaluation  Patient identified by MRN, date of birth, ID band Patient awake    Reviewed: Allergy & Precautions, NPO status , Patient's Chart, lab work & pertinent test results  History of Anesthesia Complications Negative for: history of anesthetic complications  Airway Mallampati: I  TM Distance: >3 FB Neck ROM: Full    Dental  (+) Teeth Intact, Dental Advisory Given   Pulmonary neg pulmonary ROS, neg shortness of breath, neg sleep apnea, neg COPD, neg recent URI,    Pulmonary exam normal breath sounds clear to auscultation       Cardiovascular negative cardio ROS   Rhythm:Regular Rate:Normal     Neuro/Psych negative neurological ROS     GI/Hepatic negative GI ROS, Neg liver ROS, neg GERD  ,  Endo/Other  neg diabetesMorbid obesity  Renal/GU negative Renal ROS     Musculoskeletal   Abdominal (+) + obese,   Peds  Hematology negative hematology ROS (+)   Anesthesia Other Findings   Reproductive/Obstetrics                            Anesthesia Physical Anesthesia Plan  ASA: II  Anesthesia Plan: General   Post-op Pain Management:    Induction: Intravenous  Airway Management Planned: Oral ETT  Additional Equipment:   Intra-op Plan:   Post-operative Plan: Extubation in OR  Informed Consent: I have reviewed the patients History and Physical, chart, labs and discussed the procedure including the risks, benefits and alternatives for the proposed anesthesia with the patient or authorized representative who has indicated his/her understanding and acceptance.   Dental advisory given  Plan Discussed with:   Anesthesia Plan Comments:        Anesthesia Quick Evaluation

## 2016-01-07 NOTE — Anesthesia Postprocedure Evaluation (Signed)
Anesthesia Post Note  Patient: Beverly Christian  Procedure(s) Performed: Procedure(s) (LRB): LAPAROSCOPIC GASTRIC SLEEVE RESECTION, UPPER ENDOSCOPY (N/A)  Patient location during evaluation: PACU Anesthesia Type: General Level of consciousness: awake and alert Pain management: pain level controlled Vital Signs Assessment: post-procedure vital signs reviewed and stable Respiratory status: spontaneous breathing, nonlabored ventilation and respiratory function stable Cardiovascular status: blood pressure returned to baseline and stable Postop Assessment: no signs of nausea or vomiting Anesthetic complications: no    Last Vitals:  Vitals:   01/07/16 1515 01/07/16 1538  BP: (!) 116/55 139/80  Pulse: (!) 105 92  Resp: 12 14  Temp:  36.8 C    Last Pain:  Vitals:   01/07/16 1500  TempSrc:   PainSc: 3                  Linton RumpJennifer Dickerson Beverly Christian

## 2016-01-07 NOTE — Interval H&P Note (Signed)
History and Physical Interval Note:  01/07/2016 12:10 PM  Beverly EagleJean Christian  has presented today for surgery, with the diagnosis of Morbid Obesity  The various methods of treatment have been discussed with the patient and family. After consideration of risks, benefits and other options for treatment, the patient has consented to  Procedure(s): LAPAROSCOPIC GASTRIC SLEEVE RESECTION, UPPER ENDO (N/A) as a surgical intervention .  The patient's history has been reviewed, patient examined, no change in status, stable for surgery.  I have reviewed the patient's chart and labs.  Questions were answered to the patient's satisfaction.     Tiyah Zelenak T

## 2016-01-07 NOTE — H&P (Signed)
History of Present Illness  The patient is a 30 year old female who presents with obesity. She returns for preop evaluation prior to planned laparoscopic sleeve gastrectomy for morbid obesity.  She was referred by Dr Janyth Contes for consideration for surgical treatment for morbid obesity. The patient gives a history of progressive obesity since adolescence despite multiple attempts at medical management. She states she weighed over 300 pounds at age 1. She was able to lose most of the weight but regained during pregnancy. Again she lost it but regained during her second pregnancy a number of years ago and has been unable to lose a lot of weight since that time. Obesity has been affecting the patient in a number of ways including increasing difficulty with activities of daily living and performing her job. She notices shortness of breath with exertion and aching joints and fatigue. She fortunately has been able to avoid significant medical illnesses. She is however very concerned about her long-term health as diabetes tends to run in her family and she sees obesity and comorbid illnesses extensively in her family.  She is about to complete her final preoperative supervised weight loss visits. She has been able to lose approximately 20 pounds. She has had no intercurrent illnesses. Upper GI series and ultrasound were normal preoperatively.     Problem List/Past Medical MORBID OBESITY (E66.01)  Other Problems  Unspecified Diagnosis Depression  Past Surgical History No pertinent past surgical history  Diagnostic Studies History  Mammogram never Pap Smear 1-5 years ago Colonoscopy never  Allergies  PenicillAMINE *Assorted Classes** Amoxicillin *PENICILLINS*  Medication History  OxyCODONE HCl ( /5ML Solution, 5-10 Milliliter Oral every four hours, as needed, Taken starting 11/07/2015) Active. Protonix (  Tablet DR, 1 (one) Tablet Oral daily, Taken starting 11/07/2015)  Active. Zofran ODT (  Tablet Disperse, 1 (one) Tablet Oral every six hours, as needed, Taken starting 11/07/2015) Active. Diazepam (  Tablet, Oral) Active. Naproxen (  Tablet, Oral) Active. Ibuprofen (  Tablet, Oral) Active. Cyclobenzaprine HCl (  Tablet, Oral) Active. Enskyce (0.15-30MG -MCG Tablet, Oral) Active.  Social History  Tobacco use Never smoker. Caffeine use Carbonated beverages, Coffee, Tea. No drug use Alcohol use Occasional alcohol use.  Family History  Migraine Headache Mother. Heart disease in female family member before age 62 Depression Mother. Diabetes Mellitus Father.  Pregnancy / Birth History  Age at menarche 8 years. Contraceptive History Oral contraceptives. Gravida 4 Regular periods Maternal age 35-20 Para 2    Physical Exam   The physical exam findings are as follows: Note:General: Alert, obese Caucasian female, in no distress Skin: Warm and dry without rash or infection. HEENT: No palpable masses or thyromegaly. Sclera nonicteric. Pupils equal round and reactive. Oropharynx clear. Lymph nodes: No cervical, supraclavicular, or inguinal nodes palpable. Lungs: Breath sounds clear and equal. No wheezing or increased work of breathing. Cardiovascular: Regular rate and rhythm without murmer. No JVD or edema. Peripheral pulses intact. No carotid bruits. Abdomen: Nondistended. Soft and nontender. No masses palpable. No organomegaly. No palpable hernias. Extremities: No edema or joint swelling or deformity. No chronic venous stasis changes. Neurologic: Alert and fully oriented. Gait normal. No focal weakness. Psychiatric: Normal mood and affect. Thought content appropriate with normal judgement and insight    Assessment & Plan   Current Plans Schedule for Surgery  Laparoscopic sleeve gastrectomy  MORBID OBESITY (E66.01) Impression: Patient with progressive morbid obesity unresponsive to multiple efforts at  medical management who presents with a BMI of 45.5. I believe there would be very significant medical benefit from  surgical weight loss. Ready to proceed with planned laparoscopic sleeve gastrectomy. We again discussed the procedure in detail including complications and reviewed the consent form. Discussed expected recovery. She is given prescriptions for pain and nausea medication and Protonix. All her questions were answered.

## 2016-01-08 LAB — CBC WITH DIFFERENTIAL/PLATELET
BASOS ABS: 0 10*3/uL (ref 0.0–0.1)
BASOS PCT: 0 %
EOS ABS: 0 10*3/uL (ref 0.0–0.7)
EOS PCT: 0 %
HCT: 39.8 % (ref 36.0–46.0)
Hemoglobin: 13.6 g/dL (ref 12.0–15.0)
LYMPHS ABS: 0.7 10*3/uL (ref 0.7–4.0)
LYMPHS PCT: 6 %
MCH: 28.6 pg (ref 26.0–34.0)
MCHC: 34.2 g/dL (ref 30.0–36.0)
MCV: 83.6 fL (ref 78.0–100.0)
MONOS PCT: 4 %
Monocytes Absolute: 0.4 10*3/uL (ref 0.1–1.0)
NEUTROS ABS: 10.2 10*3/uL — AB (ref 1.7–7.7)
NEUTROS PCT: 90 %
PLATELETS: 281 10*3/uL (ref 150–400)
RBC: 4.76 MIL/uL (ref 3.87–5.11)
RDW: 13.4 % (ref 11.5–15.5)
WBC: 11.4 10*3/uL — ABNORMAL HIGH (ref 4.0–10.5)

## 2016-01-08 LAB — HEMOGLOBIN AND HEMATOCRIT, BLOOD
HEMATOCRIT: 37 % (ref 36.0–46.0)
HEMOGLOBIN: 12.7 g/dL (ref 12.0–15.0)

## 2016-01-08 MED FILL — oxyCODONE HCL 5 MG/5ML SOLN: 5 | 4 days supply | Qty: 200 | Fill #0

## 2016-01-08 NOTE — Discharge Summary (Signed)
   Patient ID: Beverly Christian [id number] 30 y.o. 1985/12/25  01/07/2016  Discharge date and time: 01/08/2016   Admitting Physician: Glenna FellowsHOXWORTH,Lavaris Sexson T  Discharge Physician: Glenna FellowsHOXWORTH,Jaymin Waln T  Admission Diagnoses: Morbid Obesity  Discharge Diagnoses: Same  Operations: Procedure(s): LAPAROSCOPIC GASTRIC SLEEVE RESECTION, UPPER ENDOSCOPY  Admission Condition: good  Discharged Condition: good  Indication for Admission: Patient is a 30 year old female with progressive morbid obesity unresponsive to medical management who presented at a BMI of 45. After extensive preoperative workup and discussion detailed elsewhere we have elected to proceed with laparoscopic sleeve gastrectomy is initial surgical treatment for her obesity.  Hospital Course: On the morning of admission she underwent an uneventful sleeve gastrectomy. Her postoperative course was very smooth. She had mild pain and nausea that was well controlled the first evening. She was begun on ice and water on the day of surgery which she tolerated. On the first postoperative day she has minimally elevated white blood count and stable hemoglobin. Vital signs are within normal limits. She is comfortable and tolerating protein shakes. Abdomen is soft and nontender and wounds are healing well. She is felt ready for discharge.   Disposition: Home  Patient Instructions:    Medication List    You have not been prescribed any medications.     Activity: activity as tolerated Diet: bariatric protein shakes Wound Care: none needed  Follow-up:  With Dr. Johna SheriffHoxworth in 3 weeks.  Signed: Mariella SaaBenjamin T Meerab Maselli MD, FACS  01/08/2016, 12:46 PM

## 2016-01-08 NOTE — Plan of Care (Signed)
Problem: Food- and Nutrition-Related Knowledge Deficit (NB-1.1) Goal: Nutrition education Formal process to instruct or train a patient/client in a skill or to impart knowledge to help patients/clients voluntarily manage or modify food choices and eating behavior to maintain or improve health.  Outcome: Completed/Met Date Met: 01/08/16 Nutrition Education Note  Received consult for diet education per DROP protocol.   Discussed 2 week post op diet with pt. Emphasized that liquids must be non carbonated, non caffeinated, and sugar free. Fluid goals discussed. Reviewed progression of diet to include soft proteins at 7-10 days post-op. Pt to follow up with outpatient bariatric RD for further diet progression after 2 weeks. Multivitamins and minerals also reviewed. Teach back method used, pt expressed understanding, expect good compliance.   Diet: First 2 Weeks  You will see the dietitian about two (2) weeks after your surgery. The dietitian will increase the types of foods you can eat if you are handling liquids well:  If you have severe vomiting or nausea and cannot handle clear liquids lasting longer than 1 day, call your surgeon  Protein Shake  Drink at least 2 ounces of shake 5-6 times per day  Each serving of protein shakes (usually 8 - 12 ounces) should have a minimum of:  15 grams of protein  And no more than 5 grams of carbohydrate  Goal for protein each day:  Men = 80 grams per day  Women = 60 grams per day  Protein powder may be added to fluids such as non-fat milk or Lactaid milk or Soy milk (limit to 35 grams added protein powder per serving)   Hydration  Slowly increase the amount of water and other clear liquids as tolerated (See Acceptable Fluids)  Slowly increase the amount of protein shake as tolerated  Sip fluids slowly and throughout the day  May use sugar substitutes in small amounts (no more than 6 - 8 packets per day; i.e. Splenda)   Fluid Goal  The first goal is to  drink at least 8 ounces of protein shake/drink per day (or as directed by the nutritionist); some examples of protein shakes are [employer], [employer], EAS Edge HP, and Unjury. See handout from pre-op Bariatric Education Class:  Slowly increase the amount of protein shake you drink as tolerated  You may find it easier to slowly sip shakes throughout the day  It is important to get your proteins in first  Your fluid goal is to drink 64 - 100 ounces of fluid daily  It may take a few weeks to build up to this  32 oz (or more) should be clear liquids  And  32 oz (or more) should be full liquids (see below for examples)  Liquids should not contain sugar, caffeine, or carbonation   Clear Liquids:  Water or Sugar-free flavored water (i.e. Fruit H2O, Propel)  Decaffeinated coffee or tea (sugar-free)  Crystal Lite, Wyler's Lite, Minute Maid Lite  Sugar-free Jell-O  Bouillon or broth  Sugar-free Popsicle: *Less than 20 calories each; Limit 1 per day   Full Liquids:  Protein Shakes/Drinks + 2 choices per day of other full liquids  Full liquids must be:  No More Than 12 grams of Carbs per serving  No More Than 3 grams of Fat per serving  Strained low-fat cream soup  Non-Fat milk  Fat-free Lactaid Milk  Sugar-free yogurt (Dannon Lite & Fit, Greek yogurt)     Clayton Bibles, MS, RD, LDN Pager: [phone] After Hours Pager: [phone]

## 2016-01-08 NOTE — Progress Notes (Signed)
Discharge instructions given to patient questions answered 

## 2016-01-09 ENCOUNTER — Telehealth (HOSPITAL_COMMUNITY): Payer: Self-pay

## 2016-01-09 NOTE — Telephone Encounter (Signed)
Made discharge phone call to patient per DROP protocol. Asking the following questions.    1. Do you have someone to care for you now that you are home?  yes 2. Are you having pain now that is not relieved by your pain medication?  no 3. Are you able to drink the recommended daily amount of fluids (48 ounces minimum/day) and protein (60-80 grams/day) as prescribed by the dietitian or nutritional counselor?  Working on it, about 20 oz of water, and almost finished with protein  4. Are you taking the vitamins and minerals as prescribed?  I am working on 5. Do you have the "on call" number to contact your surgeon if you have a problem or question?  yes 6. Are your incisions free of redness, swelling or drainage? (If steri strips, address that these can fall off, shower as tolerated) no 7. Have your bowels moved since your surgery?  If not, are you passing gas?  No, lots of gas 8. Are you up and walking 3-4 times per day?  yes

## 2016-01-21 ENCOUNTER — Ambulatory Visit: Payer: Self-pay

## 2016-02-19 ENCOUNTER — Encounter: Payer: Managed Care, Other (non HMO) | Attending: General Surgery | Admitting: Dietician

## 2016-02-19 ENCOUNTER — Encounter: Payer: Self-pay | Admitting: Dietician

## 2016-02-19 DIAGNOSIS — Z713 Dietary counseling and surveillance: Secondary | ICD-10-CM | POA: Insufficient documentation

## 2016-02-19 DIAGNOSIS — Z6841 Body Mass Index (BMI) 40.0 and over, adult: Secondary | ICD-10-CM

## 2016-02-19 NOTE — Patient Instructions (Addendum)
Goals:  Follow Phase 3B: High Protein + Non-Starchy Vegetables  Eat 3-6 small meals/snacks, every 3-5 hrs  Increase lean protein foods to meet 60g goal  Keep drinking shakes if you are unable to eat  Phase out shakes as you are able to tolerate more food  Increase fluid intake to 64oz +  Avoid drinking 15 minutes before, during and 30 minutes after eating  Aim for >30 min of physical activity daily  Surgery date: 01/07/2016 Surgery type: sleeve gastrectomy [facility] on 03/02/2015 (266 lbs on surgery day) Weight today: 241.8 lbs Weight change: 25 lbs

## 2016-02-19 NOTE — Progress Notes (Signed)
  Follow-up visit:  6 Weeks Post-Operative Sleeve gastrectomy Surgery  Medical Nutrition Therapy:  Appt start time: 1005 end time:  1100.  Primary concerns today: Post-operative Bariatric Surgery Nutrition Management. Beverly Christian returns having lost 25 pounds. She reports not feeling hungry at all and has to force herself to eat. Has noticed more stamina. Takes her about an hour to eat an oz of meat.   Surgery date: 01/07/2016 Surgery type: sleeve gastrectomy [facility] lbs on 03/02/2015 (266 lbs on surgery day) Weight today: 241.8 lbs Weight change: 25 lbs Weight loss goal: 160-165 lbs  TANITA  BODY COMP RESULTS  12/23/15 02/19/16   BMI (kg/m^2) N/A 39.6   Fat Mass (lbs)  117.6   Fat Free Mass (lbs)  124.2   Total Body Water (lbs)  91.6    Preferred Learning Style:   No preference indicated   Learning Readiness:   Ready  24-hr recall: 1-2 protein shakes per day B (AM): Premier protein shake OR 2 Malaw[place]urkey patties (14-30g) Snk (AM):   L (PM): 1 oz chicken (7g) Snk (PM):   D (PM): 1 oz steak or lentil soup (7g) Snk (PM):   Fluid intake: 64 oz water, protein shake Estimated total protein intake: 50-60 g/day  Medications: see list Supplementation: taking  Using straws: no Drinking while eating: no Hair loss: none Carbonated beverages: no N/V/D/C: constipation resolving; vomiting 2x from eating too fast Dumping syndrome: none  Recent physical activity:  Walking and more active overall  Progress Towards Goal(s):  In progress.  Handouts given during visit include:  Phase 3B lean proteins + non starchy vegetables   Nutritional Diagnosis:  [place]-3.3 Overweight/obesity related to past poor dietary habits and physical inactivity as evidenced by patient w/ recent sleeve gastrectomy surgery following dietary guidelines for continued weight loss.     Intervention:  Nutrition counseling provided.  Teaching Method Utilized:  Visual Auditory Hands on  Barriers to  learning/adherence to lifestyle change: none  Demonstrated degree of understanding via:  Teach Back   Monitoring/Evaluation:  Dietary intake, exercise, and body weight. Follow up in 3 months for 5 month post-op visit.

## 2016-04-10 ENCOUNTER — Encounter: Payer: Managed Care, Other (non HMO) | Attending: General Surgery | Admitting: Dietician

## 2016-04-10 ENCOUNTER — Encounter: Payer: Self-pay | Admitting: Dietician

## 2016-04-10 DIAGNOSIS — Z6841 Body Mass Index (BMI) 40.0 and over, adult: Secondary | ICD-10-CM | POA: Diagnosis not present

## 2016-04-10 DIAGNOSIS — Z713 Dietary counseling and surveillance: Secondary | ICD-10-CM | POA: Insufficient documentation

## 2016-04-10 NOTE — Patient Instructions (Addendum)
Goals:  Follow Phase 3B: High Protein + Non-Starchy Vegetables  Eat 3-6 small meals/snacks, every 3-5 hrs  Increase lean protein foods to meet 60g goal  Keep drinking shakes if you are unable to eat  Phase out shakes as you are able to tolerate more food  Increase fluid intake to 64oz +  Avoid drinking 15 minutes before, during and 30 minutes after eating  Aim for >30 min of physical activity daily  Surgery date: 01/07/2016 Surgery type: sleeve gastrectomy [facility] on 03/02/2015 (266 lbs on surgery day) Weight today: 222 lbs Weight change: 20 lbs Total weight lost: 50 lbs Weight loss goal: 160-170 lbs  TANITA  BODY COMP RESULTS  12/23/15 02/19/16 04/10/16   BMI (kg/m^2) N/A 39.6 36.4   Fat Mass (lbs)  117.6 99.6   Fat Free Mass (lbs)  124.2 122.4   Total Body Water (lbs)  91.6 89.2

## 2016-04-10 NOTE — Progress Notes (Signed)
  Follow-up visit:  3 months Post-Operative Sleeve gastrectomy Surgery  Medical Nutrition Therapy:  Appt start time: 1100 end time:  1150  Primary concerns today: Post-operative Bariatric Surgery Nutrition Management. Beverly Christian returns having lost another 20 pounds. She feels like the amounts and types of food she can eat change from day to day. Still feeling a lot of restriction. No longer likes the taste of sweet foods (honey in tea or sweet sauces). Eating a lot of lentils. Can eat 1-2 oz meat and notices she can eat more if foods are very moist (soup or chili). Not having as many protein shakes.   Nonscale victories: walking more (trick-or-treating), fitting into smaller clothes, crossing legs  Surgery date: 01/07/2016 Surgery type: sleeve gastrectomy [facility] lbs on 03/02/2015 (266 lbs on surgery day) Weight today: 222 lbs Weight change: 20 lbs Total weight lost: 50 lbs Weight loss goal: 160-170 lbs  TANITA  BODY COMP RESULTS  12/23/15 02/19/16 04/10/16   BMI (kg/m^2) N/A 39.6 36.4   Fat Mass (lbs)  117.6 99.6   Fat Free Mass (lbs)  124.2 122.4   Total Body Water (lbs)  91.6 89.2    Preferred Learning Style:   No preference indicated   Learning Readiness:   Ready  24-hr recall: B (AM): sausage patty or tea Snk (AM):   L (PM): sometimes skips Snk (PM):   D (PM): 1-2 oz chicken or lentil soup (14g) Snk (PM):   Fluid intake: 64 oz water, hot decaf tea Estimated total protein intake: 50-60 g/day  Medications: see list Supplementation: taking  Using straws: no Drinking while eating: no Hair loss: none Carbonated beverages: no N/V/D/C: none Dumping syndrome: none  Recent physical activity:  Walking and more active overall  Progress Towards Goal(s):  In progress.  Handouts given during visit include:  none   Nutritional Diagnosis:  -3.3 Overweight/obesity related to past poor dietary habits and physical inactivity as evidenced by patient w/ recent  sleeve gastrectomy surgery following dietary guidelines for continued weight loss.     Intervention:  Nutrition counseling provided.  Teaching Method Utilized:  Visual Auditory Hands on  Barriers to learning/adherence to lifestyle change: none  Demonstrated degree of understanding via:  Teach Back   Monitoring/Evaluation:  Dietary intake, exercise, and body weight. Follow up in 3 months.

## 2016-10-27 IMAGING — DX DG CHEST 2V
2 series · 2 of 2 positions shown · non-contrast
Comparison: None in PACs

CLINICAL DATA: Preoperative examination prior to bariatric surgery.

EXAM:
CHEST  2 VIEW

[chest pa]
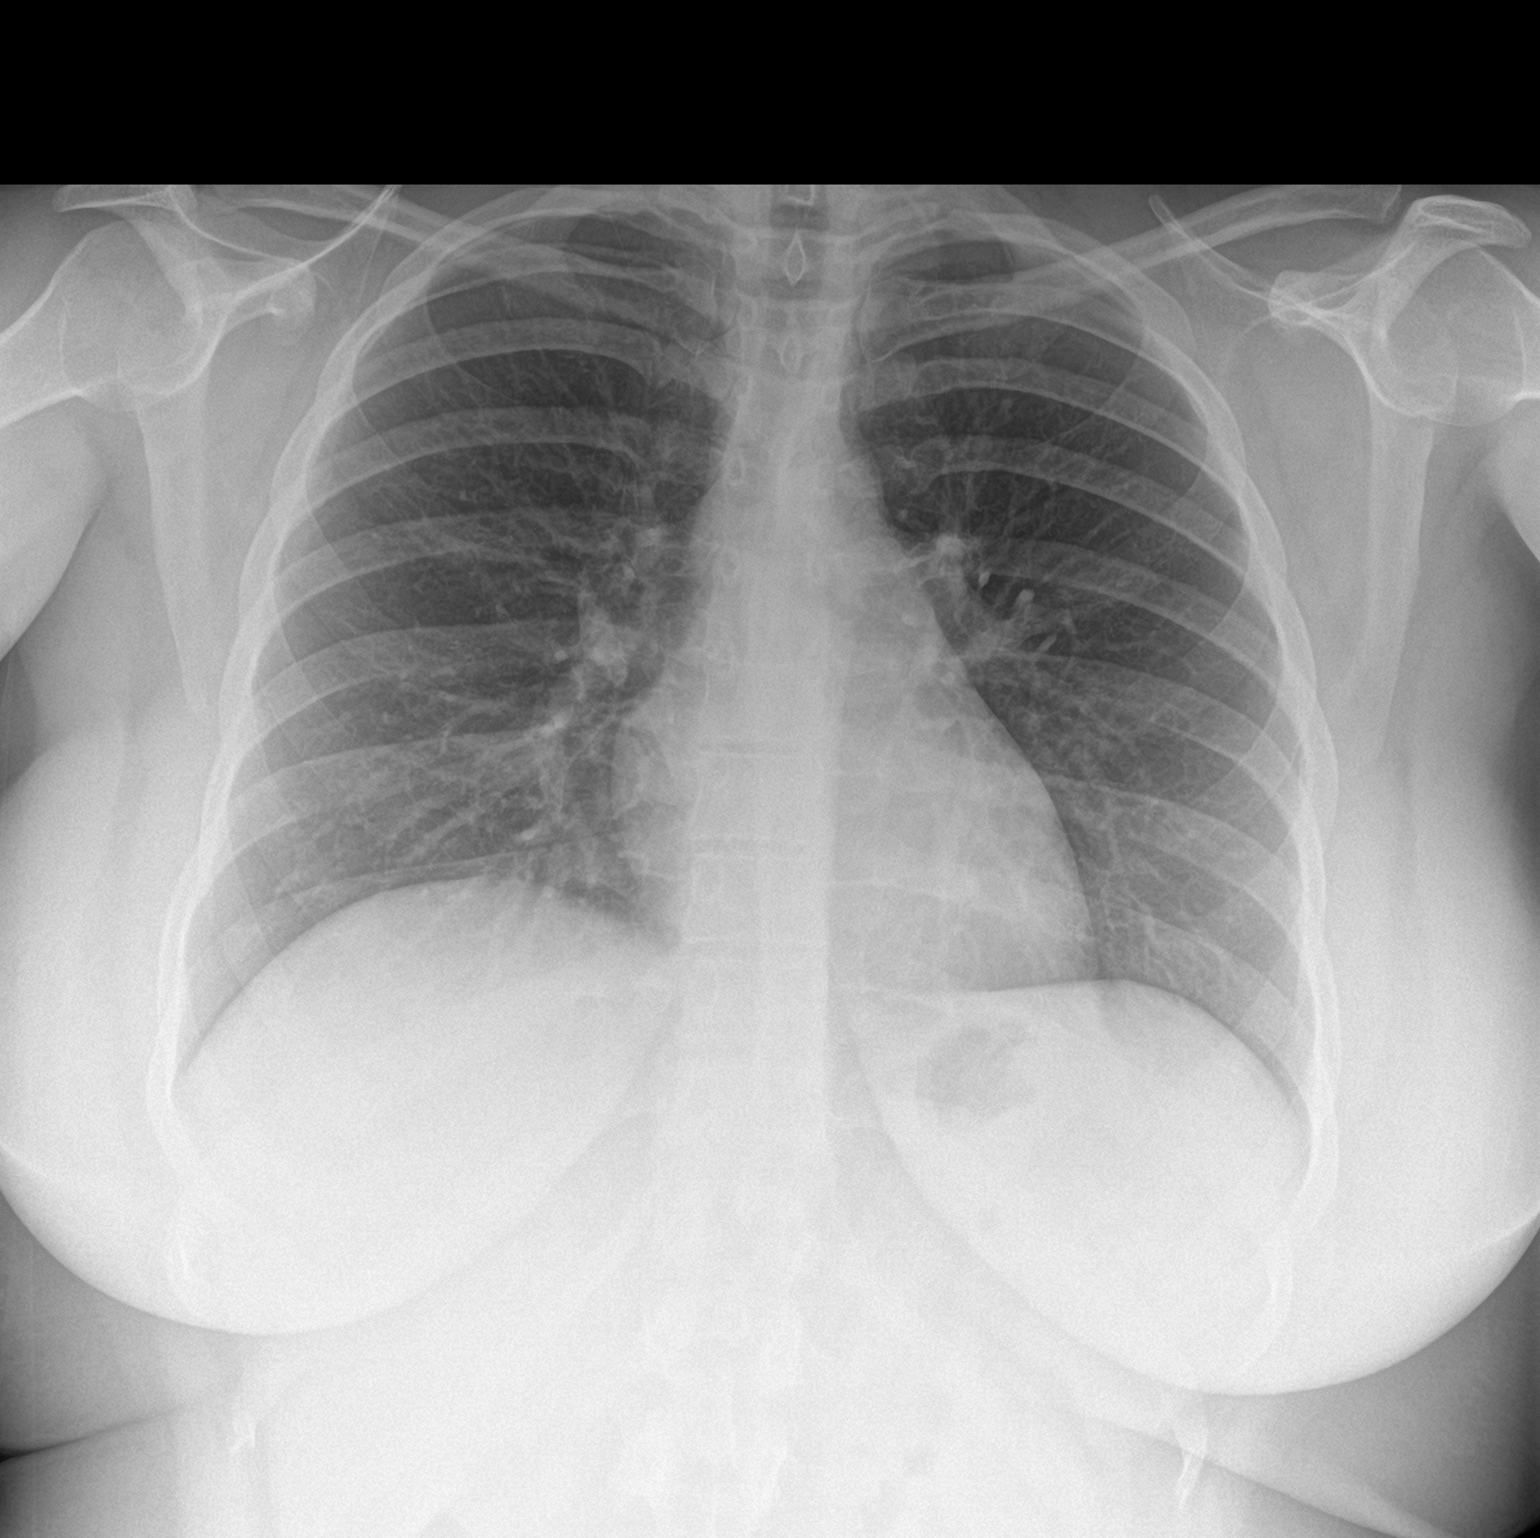

[chest lat]
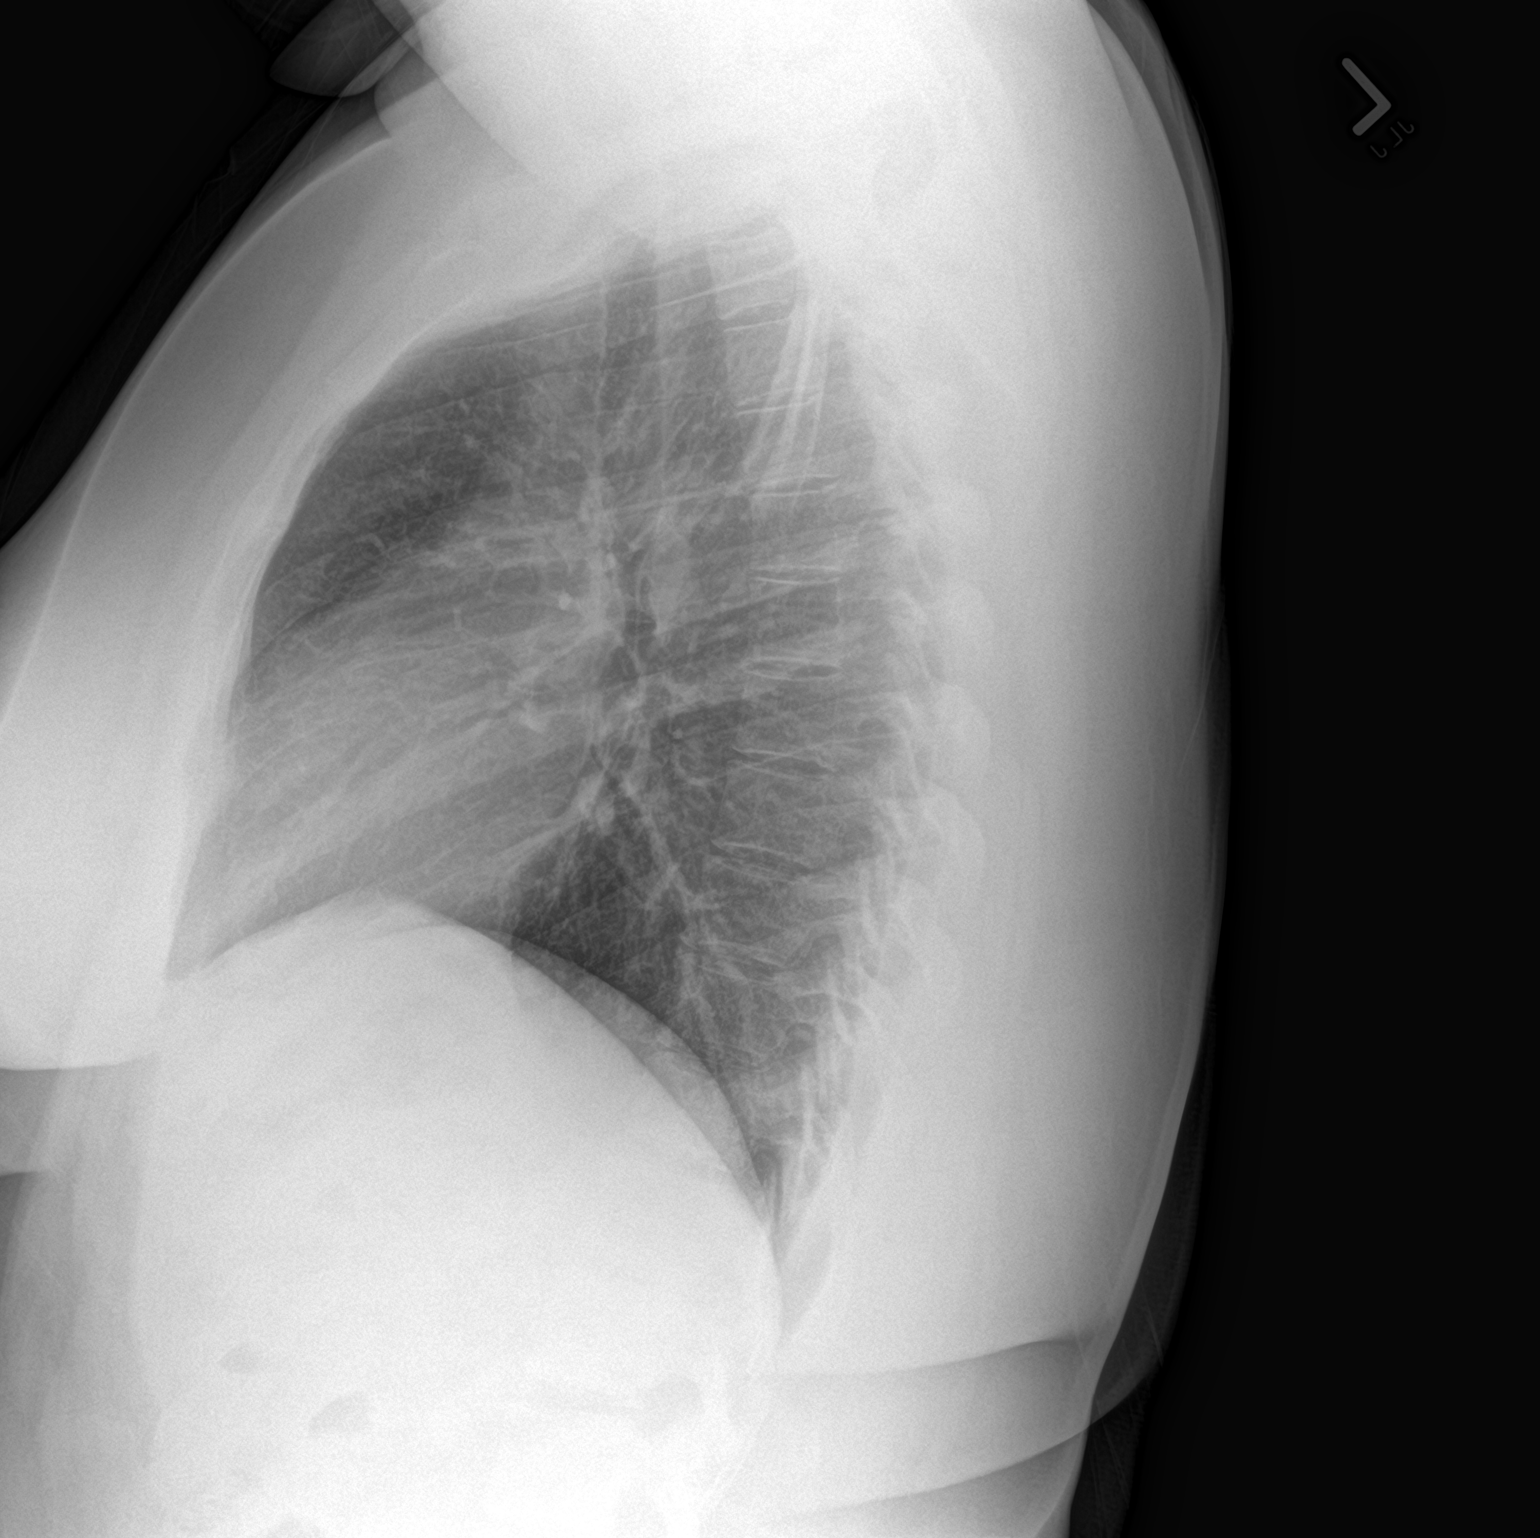

[2 of 2 positions shown; findings below may reference images not displayed]

FINDINGS: The lungs are adequately inflated and clear. The heart and
mediastinal structures are normal. There is no pleural effusion. The
gastric air bubble is on the left. The bony thorax is unremarkable.
IMPRESSION: There is no active cardiopulmonary disease.

## 2017-08-12 ENCOUNTER — Encounter (HOSPITAL_COMMUNITY): Payer: Self-pay
